# Patient Record
Sex: Female | Born: 1975 | Race: White | Hispanic: No | Marital: Single | Smoking: Never smoker
Health system: Southern US, Community
[De-identification: ages and names within clinical notes are randomized; demographics above are authoritative.]

## PROBLEM LIST (undated history)

## (undated) ENCOUNTER — Ambulatory Visit: Admission: RE | Discharge status: Absent without leave | Payer: BLUE CROSS/BLUE SHIELD | Source: Ambulatory Visit

---

## 1998-11-09 ENCOUNTER — Inpatient Hospital Stay (HOSPITAL_COMMUNITY): Admission: AD | Admit: 1998-11-09 | Discharge: 1998-11-09 | Payer: Self-pay | Admitting: Obstetrics & Gynecology

## 1998-11-19 ENCOUNTER — Other Ambulatory Visit: Admission: RE | Admit: 1998-11-19 | Discharge: 1998-11-19 | Payer: Self-pay | Admitting: Obstetrics and Gynecology

## 1998-11-28 ENCOUNTER — Inpatient Hospital Stay (HOSPITAL_COMMUNITY): Admission: AD | Admit: 1998-11-28 | Discharge: 1998-11-28 | Payer: Self-pay | Admitting: Obstetrics & Gynecology

## 1999-01-08 ENCOUNTER — Encounter: Payer: Self-pay | Admitting: Obstetrics and Gynecology

## 1999-01-08 ENCOUNTER — Inpatient Hospital Stay (HOSPITAL_COMMUNITY): Admission: AD | Admit: 1999-01-08 | Discharge: 1999-01-08 | Payer: Self-pay | Admitting: Obstetrics and Gynecology

## 1999-03-11 ENCOUNTER — Other Ambulatory Visit: Admission: RE | Admit: 1999-03-11 | Discharge: 1999-03-11 | Payer: Self-pay | Admitting: Obstetrics and Gynecology

## 1999-03-11 ENCOUNTER — Encounter (INDEPENDENT_AMBULATORY_CARE_PROVIDER_SITE_OTHER): Payer: Self-pay

## 1999-05-09 ENCOUNTER — Inpatient Hospital Stay (HOSPITAL_COMMUNITY): Admission: AD | Admit: 1999-05-09 | Discharge: 1999-05-09 | Payer: Self-pay | Admitting: Obstetrics and Gynecology

## 1999-05-27 ENCOUNTER — Inpatient Hospital Stay (HOSPITAL_COMMUNITY): Admission: AD | Admit: 1999-05-27 | Discharge: 1999-05-27 | Payer: Self-pay | Admitting: Obstetrics and Gynecology

## 1999-06-22 ENCOUNTER — Inpatient Hospital Stay (HOSPITAL_COMMUNITY): Admission: AD | Admit: 1999-06-22 | Discharge: 1999-06-24 | Payer: Self-pay | Admitting: Obstetrics and Gynecology

## 1999-06-27 ENCOUNTER — Inpatient Hospital Stay (HOSPITAL_COMMUNITY): Admission: AD | Admit: 1999-06-27 | Discharge: 1999-06-27 | Payer: Self-pay | Admitting: Obstetrics and Gynecology

## 1999-07-30 ENCOUNTER — Other Ambulatory Visit: Admission: RE | Admit: 1999-07-30 | Discharge: 1999-07-30 | Payer: Self-pay | Admitting: Obstetrics and Gynecology

## 1999-12-08 ENCOUNTER — Other Ambulatory Visit: Admission: RE | Admit: 1999-12-08 | Discharge: 1999-12-08 | Payer: Self-pay | Admitting: Obstetrics and Gynecology

## 2001-02-27 ENCOUNTER — Inpatient Hospital Stay (HOSPITAL_COMMUNITY): Admission: AD | Admit: 2001-02-27 | Discharge: 2001-03-03 | Payer: Self-pay | Admitting: Obstetrics and Gynecology

## 2001-04-20 ENCOUNTER — Other Ambulatory Visit: Admission: RE | Admit: 2001-04-20 | Discharge: 2001-04-20 | Payer: Self-pay | Admitting: Obstetrics and Gynecology

## 2004-06-03 ENCOUNTER — Other Ambulatory Visit: Admission: RE | Admit: 2004-06-03 | Discharge: 2004-06-03 | Payer: Self-pay | Admitting: Obstetrics and Gynecology

## 2005-10-27 ENCOUNTER — Other Ambulatory Visit: Admission: RE | Admit: 2005-10-27 | Discharge: 2005-10-27 | Payer: Self-pay | Admitting: Obstetrics and Gynecology

## 2009-02-28 ENCOUNTER — Emergency Department (HOSPITAL_COMMUNITY): Admission: EM | Admit: 2009-02-28 | Discharge: 2009-02-28 | Payer: Self-pay | Admitting: Emergency Medicine

## 2010-11-28 LAB — DIFFERENTIAL
Basophils Absolute: 0 10*3/uL (ref 0.0–0.1)
Eosinophils Absolute: 0 10*3/uL (ref 0.0–0.7)
Eosinophils Relative: 0 % (ref 0–5)
Lymphs Abs: 1.4 10*3/uL (ref 0.7–4.0)

## 2010-11-28 LAB — URINALYSIS, ROUTINE W REFLEX MICROSCOPIC
Bilirubin Urine: NEGATIVE
Glucose, UA: NEGATIVE mg/dL
Hgb urine dipstick: NEGATIVE
Ketones, ur: NEGATIVE mg/dL
Nitrite: NEGATIVE
Protein, ur: NEGATIVE mg/dL
Specific Gravity, Urine: 1.005 (ref 1.005–1.030)
Urobilinogen, UA: 0.2 mg/dL (ref 0.0–1.0)
pH: 7.5 (ref 5.0–8.0)

## 2010-11-28 LAB — D-DIMER, QUANTITATIVE: D-Dimer, Quant: 0.75 ug/mL-FEU — ABNORMAL HIGH (ref 0.00–0.48)

## 2010-11-28 LAB — BASIC METABOLIC PANEL
BUN: 2 mg/dL — ABNORMAL LOW (ref 6–23)
CO2: 28 mEq/L (ref 19–32)
Calcium: 8.8 mg/dL (ref 8.4–10.5)
Chloride: 91 mEq/L — ABNORMAL LOW (ref 96–112)
Creatinine, Ser: 0.57 mg/dL (ref 0.4–1.2)
GFR calc Af Amer: >60 mL/min (ref >60)
GFR calc non Af Amer: >60 mL/min (ref >60)
Glucose, Bld: 113 mg/dL — ABNORMAL HIGH (ref 70–99)
Potassium: 3.6 mEq/L (ref 3.5–5.1)
Sodium: 129 mEq/L — ABNORMAL LOW (ref 135–145)

## 2010-11-28 LAB — CBC
HCT: 34.8 % — ABNORMAL LOW (ref 36.0–46.0)
MCV: 96 fL (ref 78.0–100.0)
Platelets: 294 10*3/uL (ref 150–400)
RDW: 13.5 % (ref 11.5–15.5)

## 2010-11-28 LAB — PREGNANCY, URINE: Preg Test, Ur: NEGATIVE

## 2011-01-07 NOTE — Op Note (Signed)
[facility]  Patient:    Gwendolyn Ramirez, Gwendolyn Ramirez                   MRN: [id number] Proc. Date: 02/28/01 Adm. Date:  [id number] Attending:  Marcelle Overlie                           Operative Report  PREOPERATIVE DIAGNOSES:       1. Intrauterine pregnancy at 38+5 weeks.                               2. Failure to progress.  POSTOPERATIVE DIAGNOSES:      1. Intrauterine pregnancy at 38+5 weeks.                               2. Failure to progress.  OPERATION:                    Primary low segment transverse cesarean section.  SURGEON:                      Brook A. Edward Jolly, M.D.  ANESTHESIA:                   Epidural anesthesia.  IV FLUIDS:                    1900 cc Ringers lactate.  ESTIMATED BLOOD LOSS:         1200 cc.  URINE OUTPUT:                 225 cc.  COMPLICATIONS:                None.  INDICATIONS:                  The patient is a 35 year old gravida 4, para 1 female admitted on February 27, 2001, at 38+[redacted] weeks gestation in early labor. The patients cervix was noted to be 2 to 3 cm dilated and 80% effaced with the vertex high in the pelvis.  The fetal heart rate tracing was reassuring. Artificial rupture of membranes was performed at this time, and clear fluid was noted.  The patient received Pitocin for augmentation of her labor after an epidural had been placed.  An IUPC was also placed to assist in the labor augmentation.  The patient received Pitocin per high dose protocol, and she was noted to have inadequate [place] units.  The Pitocin intravenous fluid was changed, the dosage was decreased, and augmented again.  In the morning of February 28, 2001, the patient was noted to have adequate labor; however, she failed to progress cervical dilatation beyond 4 to 5 cm.  At this time, discussion was held with the patient regarding the failure to progress, and a recommendation was made by the obstetrician to proceed with a primary low segment  transverse cesarean section.  The risks and benefits were reviewed with the patient and her husband, and they chose to proceed.  FINDINGS:                     A viable female was delivered at 57 oclock on February 28, 2001, with Apgars of 8 at one minute and 9 at five minutes.  The position of the fetal  vertex was left occiput transverse.  There was minimal amniotic fluid which was appreciated.  The placenta had a normal insertion of a three vessel cord.  The fallopian tubes and ovaries were normal bilaterally.  SPECIMENS:                    None.  DESCRIPTION OF PROCEDURE:     With an IV, epidural, and Foley catheter in place, the patient was taken to the operating room suite from her labor and delivery room.  The patient was placed in the supine position, and the abdomen was sterilely prepped and draped. After adequate anesthesia was insured, a Pfannenstiel incision was created sharply with a scalpel. This was carried down to the fascia using a scalpel.  The fascia was scored in the midline with the scalpel, and the incision was then extended bilaterally using Mayo scissors.  The rectus muscles were sharply dissected from the overlying fascia using the Mayo scissors. The rectus muscles and the pyramidalis muscles were then separated in the midline using the Mayo scissors.  The parietal peritoneum was entered sharply with the Metzenbaum scissors after it was elevated with two hemostat clamps. The incision was extended both cranially and caudally with care taken to avoid enterotomy or cystotomy.  The lower uterine segment was exposed, and a bladder flap was created sharply with Metzenbaum scissors.  The lower uterine segment was then incised transversely with the scalpel, and the uterine cavity was entered bluntly with a hemostat clamp. The incision was then extended bilaterally with a bandage scissors. A hand was then inserted through the uterine incision, and the vertex was delivered  without difficulty. The nares and mouth were suctioned, and the remainder of the infant was delivered.  The cord was doubly clamped and cut, and the newborn was carried over to the awaiting pediatricians.  The placenta was manually extracted after cord blood was obtained. The uterus was exteriorized, and a moistened lap pad was used to wipe the uterine cavity of any remaining membranes.  There were none.  Bleeding vessels along the uterine incision at this time were clamped with Allis clamps.  The uterus was noted to be boggy, and Pitocin was administered intravenously for adequate contraction of the uterus. The uterine incision was then closed by placing a running locked suture of #1 chromic.  A hematoma was noted to develop in the apices bilaterally and inferiorly.  These each responded to a figure-of-eight suture of #1 chromic which were placed after a hand was placed behind the broad ligament to assure a proper placement of the sutures within the lower uterine segments bilaterally.  Hemostasis was noted to be excellent at this time with no expansion of the hematomas.  The uterus was returned to the peritoneal cavity which was suctioned of any remaining clots.  Hemostasis was excellent. The rectus muscles were brought together in the midline by placing a figure-of-eight suture of #1 chromic. The fascia was closed with a running suture of 0 Vicryl.  The subcutaneous tissue had excellent hemostasis. The skin was closed with staples followed by a sterile bandage over this.  The uterus was expressed of any remaining clots.  The patient was cleansed of any remaining Betadine.  She was escorted to the recovery room in stable and awake condition.  There were no complications to the procedure.  All sponge, needle and instrument counts were correct. DD:  02/28/01 TD:  02/28/01 Job: [id number]

## 2011-01-07 NOTE — Discharge Summary (Signed)
[facility]  Patient:    Gwendolyn Ramirez, Gwendolyn Ramirez                   MRN: [id number] Adm. Date:  [id number] Disc. Date: [id number] Attending:  Marcelle Overlie Dictator:   Leilani Able, P.A.                           Discharge Summary  FINAL DIAGNOSES:              Intrauterine pregnancy a 38 5/[redacted] weeks gestation, failure to progress.  PROCEDURE:                    Primary low segment transverse cesarean section.  SURGEON:                      Dr. Conley Simmonds.  COMPLICATIONS:                This 35 year old G29, P1 was admitted on July 9 at 38 ______ weeks in early labor.  AROM was performed at this time with clear fluid noted.  Patient received Pitocin for augmentation of her labor. By the morning of July 10 the patient was noted to have an adequate labor pattern; however, she failed to progress cervical dilation beyond 4-5 cm.  At this point discussion was held with the patient regarding failure to progress and a recommendation was made to proceed with a cesarean section.  Patient was taken to the operating room by Dr. Conley Simmonds on February 28, 2001 where a primary low transverse cesarean section was performed with the delivery of a 7 pound 5 ounce female infant with Apgars at 8 and 9.  The delivery went without complications.  Patients postoperative course was benign without significant fevers.  She did have some postoperative anemia and was therefore started on iron during her hospital course.  She was sent home on postoperative day #3 on a regular diet.  Told to decrease activities.  Told to continue prenatal vitamins and FeSo4.  Was given Tylox #25 one to two p.o. q.4h. p.r.n. pain. Was also sent home on Zoloft 50 mg one q.d. for postpartum depression. Patient was to follow-up in the office in four weeks. DD:  03/28/01 TD:  03/28/01 Job: [id number]

## 2011-07-08 IMAGING — CT CT ANGIO CHEST
2 of 6 series · 19 of 36 positions shown · IV contrast (APPLIED)
Comparison: Chest radiographs obtained earlier today.

CLINICAL DATA: Heart palpitations.  Elevated D-dimer.

CT ANGIOGRAPHY CHEST WITH CONTRAST
TECHNIQUE: Multidetector CT imaging of the chest was performed
using the standard protocol during bolus administration of
intravenous contrast. Multiplanar CT image reconstructions
including MIPs were obtained to evaluate the vascular anatomy.
Contrast: 80 ml 6mnipaque-OCC

[Series 7: thins · axial · 0.70mm/px · z∈[-262,-22]mm · 18 of 268 slices shown]
[im 14/268  lung]
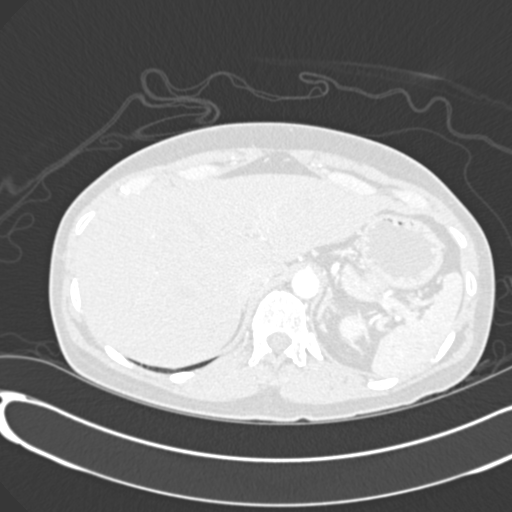
[im 27/268  mediastinal]
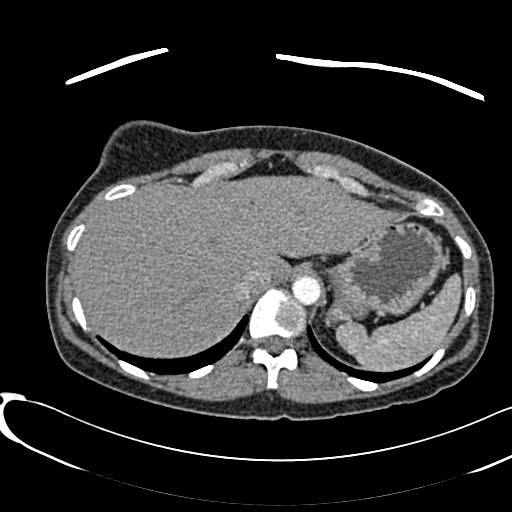
[im 41/268  lung]
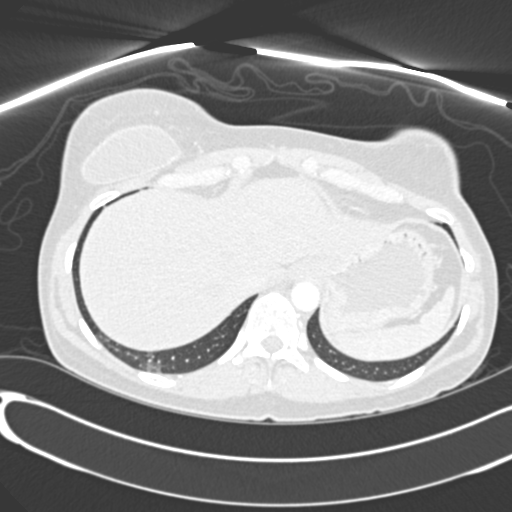
[im 54/268  mediastinal]
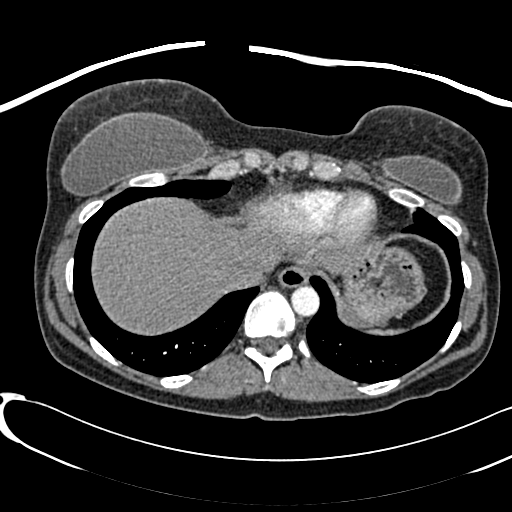
[im 67/268  lung]
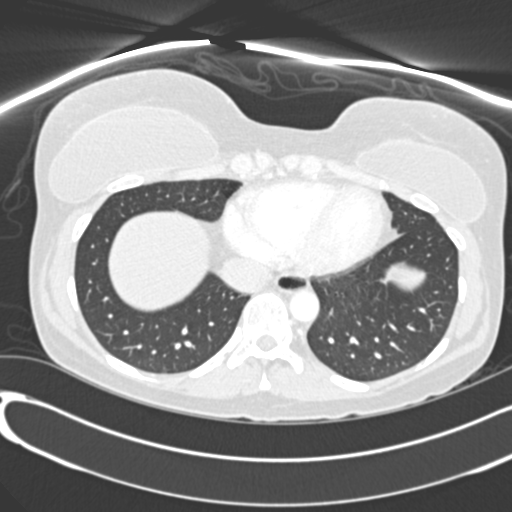
[im 81/268  mediastinal]
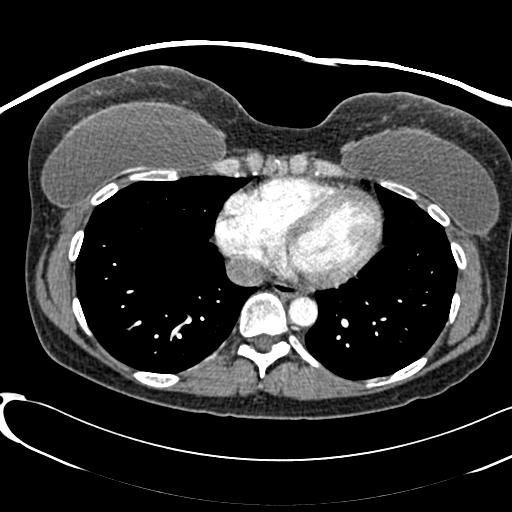
[im 94/268  lung]
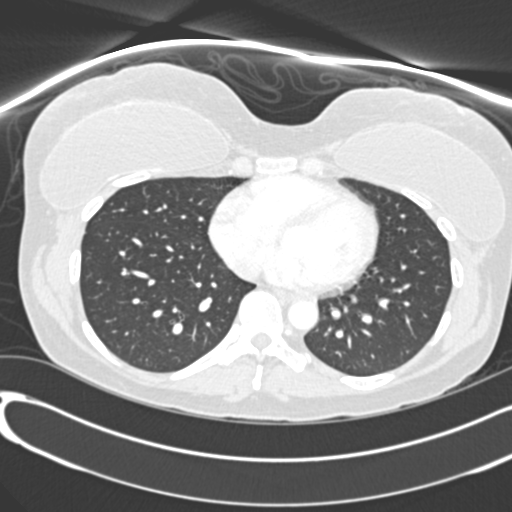
[im 107/268  mediastinal]
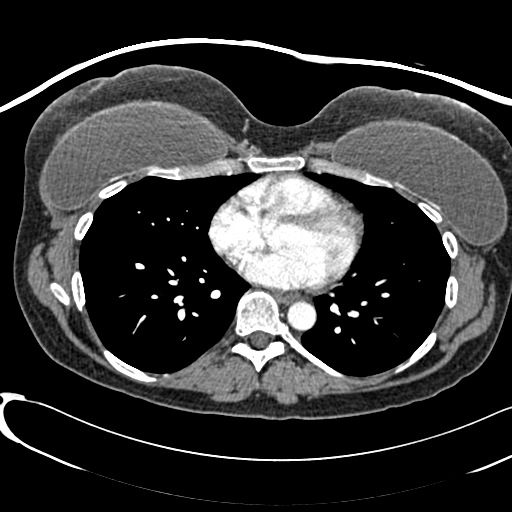
[im 121/268  lung]
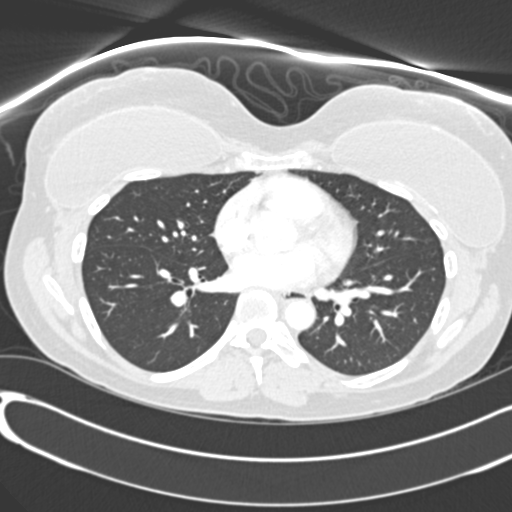
[im 147/268  mediastinal]
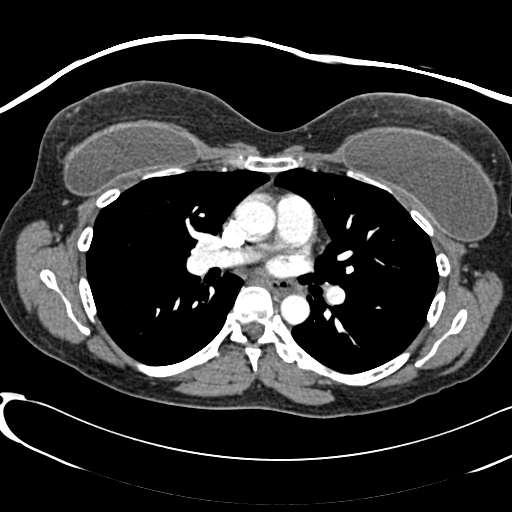
[im 161/268  lung]
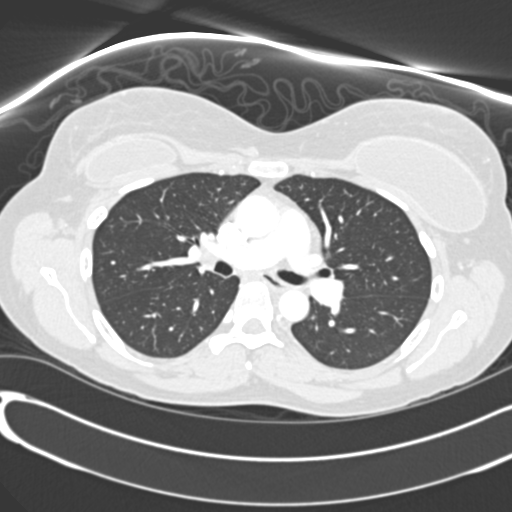
[im 174/268  mediastinal]
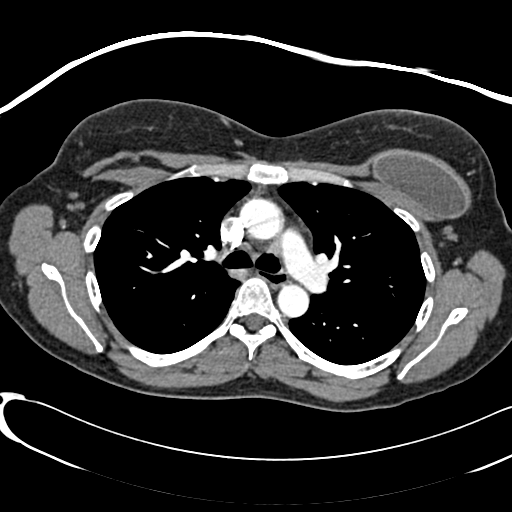
[im 187/268  lung]
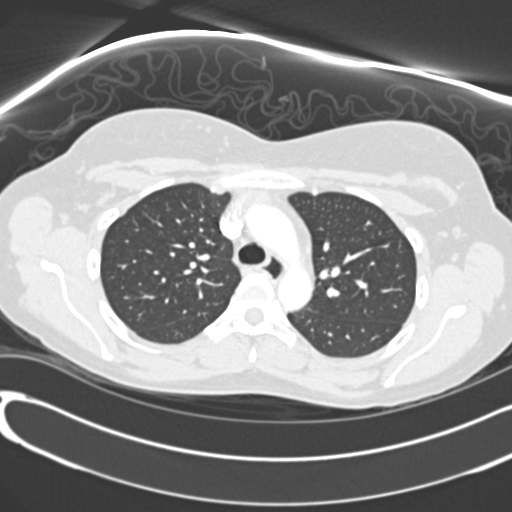
[im 201/268  mediastinal]
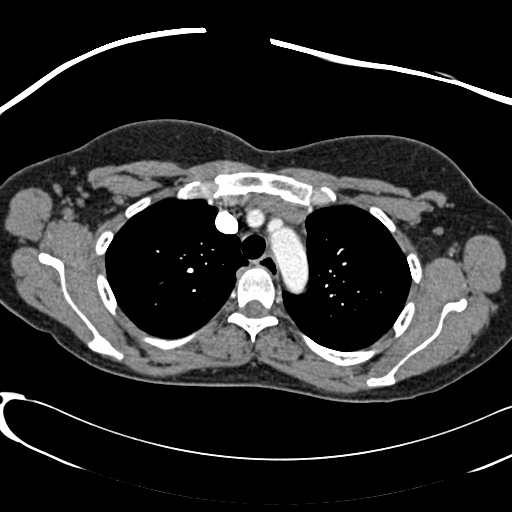
[im 214/268  lung]
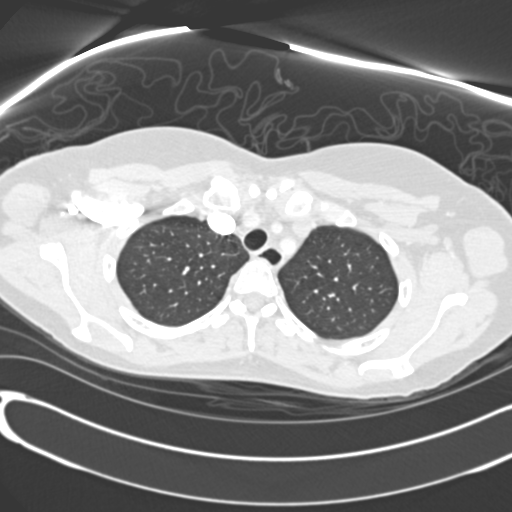
[im 227/268  mediastinal]
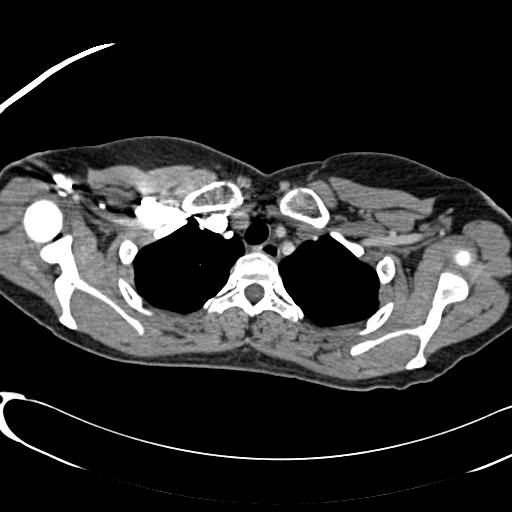
[im 241/268  lung]
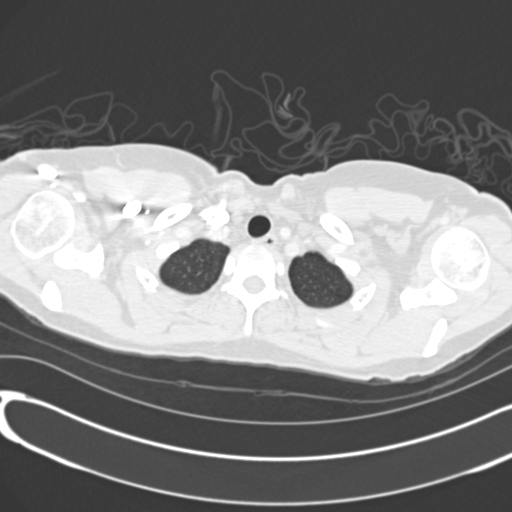
[im 254/268  mediastinal]
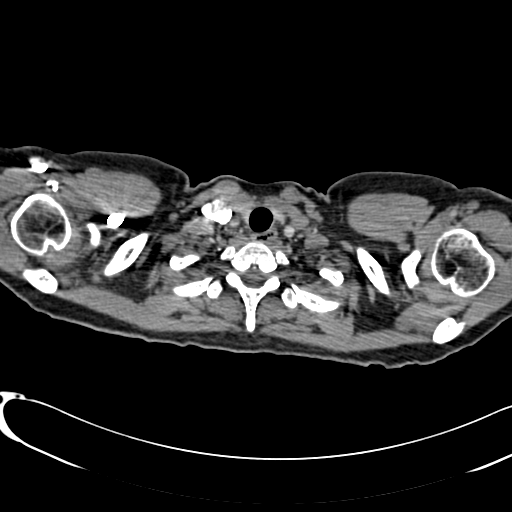

[Series 604: coronal mpr · coronal · 0.70mm/px · 1 of 89 slices shown]
[im 45/89  mediastinal]
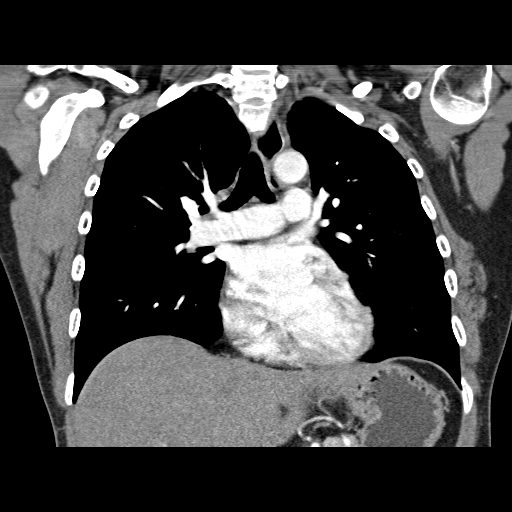

[19 of 36 positions shown; findings below may reference images not displayed]

FINDINGS: Bilateral subpectoralis breast implants.  Normally
opacified pulmonary arteries with no pulmonary arterial filling
defects seen.  Minimal right lower lobe atelectasis.  Clear left
lung.  No lung masses or enlarged lymph nodes.  Unremarkable bones
and upper abdomen.

Review of the MIP images confirms the above findings.
IMPRESSION: No pulmonary emboli or acute abnormality.

## 2014-11-20 ENCOUNTER — Emergency Department (HOSPITAL_COMMUNITY)
Admission: EM | Admit: 2014-11-20 | Discharge: 2014-11-21 | Disposition: A | Discharge status: Home / Self Care | Payer: BLUE CROSS/BLUE SHIELD | Attending: Emergency Medicine | Admitting: Emergency Medicine

## 2014-11-20 ENCOUNTER — Encounter (HOSPITAL_COMMUNITY): Payer: Self-pay | Admitting: Emergency Medicine

## 2014-11-20 DIAGNOSIS — Z3202 Encounter for pregnancy test, result negative: Secondary | ICD-10-CM | POA: Insufficient documentation

## 2014-11-20 DIAGNOSIS — R4182 Altered mental status, unspecified: Secondary | ICD-10-CM | POA: Diagnosis present

## 2014-11-20 DIAGNOSIS — F1910 Other psychoactive substance abuse, uncomplicated: Secondary | ICD-10-CM

## 2014-11-20 DIAGNOSIS — F1010 Alcohol abuse, uncomplicated: Secondary | ICD-10-CM | POA: Insufficient documentation

## 2014-11-20 DIAGNOSIS — F1410 Cocaine abuse, uncomplicated: Secondary | ICD-10-CM | POA: Insufficient documentation

## 2014-11-20 LAB — POC URINE PREG, ED: PREG TEST UR: NEGATIVE

## 2014-11-20 LAB — CBC WITH DIFFERENTIAL/PLATELET
BASOS ABS: 0 10*3/uL (ref 0.0–0.1)
BASOS PCT: 0 % (ref 0–1)
EOS ABS: 0.1 10*3/uL (ref 0.0–0.7)
EOS PCT: 1 % (ref 0–5)
HCT: 38 % (ref 36.0–46.0)
Hemoglobin: 12.5 g/dL (ref 12.0–15.0)
LYMPHS PCT: 37 % (ref 12–46)
Lymphs Abs: 2.6 10*3/uL (ref 0.7–4.0)
MCH: 31 pg (ref 26.0–34.0)
MCHC: 32.9 g/dL (ref 30.0–36.0)
MCV: 94.3 fL (ref 78.0–100.0)
Monocytes Absolute: 0.4 10*3/uL (ref 0.1–1.0)
Monocytes Relative: 6 % (ref 3–12)
Neutro Abs: 3.9 10*3/uL (ref 1.7–7.7)
Neutrophils Relative %: 56 % (ref 43–77)
PLATELETS: 320 10*3/uL (ref 150–400)
RBC: 4.03 MIL/uL (ref 3.87–5.11)
RDW: 14.4 % (ref 11.5–15.5)
WBC: 7 10*3/uL (ref 4.0–10.5)

## 2014-11-20 LAB — BASIC METABOLIC PANEL
ANION GAP: 11 (ref 5–15)
BUN: 5 mg/dL — ABNORMAL LOW (ref 6–23)
CALCIUM: 8.3 mg/dL — AB (ref 8.4–10.5)
CO2: 21 mmol/L (ref 19–32)
CREATININE: 0.67 mg/dL (ref 0.50–1.10)
Chloride: 115 mmol/L — ABNORMAL HIGH (ref 96–112)
GFR calc non Af Amer: >90 mL/min (ref >90)
Glucose, Bld: 103 mg/dL — ABNORMAL HIGH (ref 70–99)
POTASSIUM: 3.7 mmol/L (ref 3.5–5.1)
Sodium: 147 mmol/L — ABNORMAL HIGH (ref 135–145)

## 2014-11-20 LAB — RAPID URINE DRUG SCREEN, HOSP PERFORMED
AMPHETAMINES: NOT DETECTED
BARBITURATES: NOT DETECTED
Benzodiazepines: NOT DETECTED
COCAINE: POSITIVE — AB
OPIATES: NOT DETECTED
TETRAHYDROCANNABINOL: NOT DETECTED

## 2014-11-20 LAB — ETHANOL: ALCOHOL ETHYL (B): 397 mg/dL — AB (ref 0–9)

## 2014-11-20 LAB — ACETAMINOPHEN LEVEL

## 2014-11-20 LAB — SALICYLATE LEVEL: Salicylate Lvl: <4 mg/dL (ref 2.8–20.0)

## 2014-11-20 MED ORDER — ONDANSETRON HCL 4 MG/2ML IJ SOLN
4.0000 mg | Freq: Once | INTRAMUSCULAR | Status: AC
  Administered 2014-11-20: 4 mg via INTRAVENOUS
  Filled 2014-11-20: qty 2

## 2014-11-20 MED ORDER — SODIUM CHLORIDE 0.9 % IV BOLUS (SEPSIS)
1000.0000 mL | Freq: Once | INTRAVENOUS | Status: AC
  Administered 2014-11-20: 1000 mL via INTRAVENOUS

## 2014-11-20 NOTE — ED Provider Notes (Signed)
CSN: [id number]     Arrival date & time 11/20/14  1805 History   First MD Initiated Contact with Patient 11/20/14 1810     Chief Complaint  Patient presents with  . Altered Mental Status     (Consider location/radiation/quality/duration/timing/severity/associated sxs/prior Treatment) HPI    39 year old female with no sig PMH other than drinking a significant amount of alcohol, who comes in with altered mental status. Patient was reportedly in the shower when she had a possible syncopal episode.  Her boyfriend was there and he caught her, she didn't hit her head. Didn't drink a significant amount of alcohol today per him, "just a couple of drinks." She was reportedly minimally responsive for about 20 minutes but had normal vital signs and was satting well the entire time protecting her airway per EMS. On arrival to our department is able to provide some history, still a bit confused. She admits to alcohol today, but denies any other drug use. Reportedly with EMS her pupils were pinpoint to give her Narcan with possible minimal effect.  History reviewed. No pertinent past medical history. History reviewed. No pertinent past surgical history. No family history on file. History  Substance Use Topics  . Smoking status: Never Smoker   . Smokeless tobacco: Not on file  . Alcohol Use: 4.8 oz/week    4 Glasses of wine, 4 Shots of liquor per week   OB History    No data available     Review of Systems  Constitutional: Negative for fever and chills.  HENT: Negative for nosebleeds.   Eyes: Negative for visual disturbance.  Respiratory: Negative for cough and shortness of breath.   Cardiovascular: Negative for chest pain.  Gastrointestinal: Negative for nausea, vomiting, abdominal pain, diarrhea and constipation.  Genitourinary: Negative for dysuria.  Skin: Negative for rash.  Neurological: Negative for weakness.  All other systems reviewed and are negative.     Allergies  Review of  patient's allergies indicates no known allergies.  Home Medications   Prior to Admission medications   Not on File   BP 105/58 mmHg  Pulse 95  Resp 17  Ht 5\' 4"  (1.626 m)  Wt 130 lb (58.968 kg)  BMI 22.30 kg/m2  SpO2 100% Physical Exam  Constitutional: No distress.  HENT:  Head: Normocephalic and atraumatic.  Eyes: EOM are normal. Pupils are equal, round, and reactive to light.  Neck: Normal range of motion. Neck supple.  Cardiovascular: Normal rate and intact distal pulses.   Pulmonary/Chest: No respiratory distress.  Abdominal: Soft. There is no tenderness.  Musculoskeletal: Normal range of motion.  Neurological:  Sleepy but arousable.  Will open eyes to voice.  Speech slurred.  Normal strength in all four ext and normal sensation  Skin: No rash noted. She is not diaphoretic.  Psychiatric: She has a normal mood and affect.    ED Course  Procedures (including critical care time) Labs Review Labs Reviewed  BASIC METABOLIC PANEL - Abnormal; Notable for the following:    Sodium 147 (*)    Chloride 115 (*)    Glucose, Bld 103 (*)    BUN 5 (*)    Calcium 8.3 (*)    All other components within normal limits  ACETAMINOPHEN LEVEL - Abnormal; Notable for the following:    Acetaminophen (Tylenol), Serum <10.0 (*)    All other components within normal limits  ETHANOL - Abnormal; Notable for the following:    Alcohol, Ethyl (B) 397 (*)    All other  components within normal limits  URINE RAPID DRUG SCREEN (HOSP PERFORMED) - Abnormal; Notable for the following:    Cocaine POSITIVE (*)    All other components within normal limits  CBC WITH DIFFERENTIAL/PLATELET  SALICYLATE LEVEL  POC URINE PREG, ED    Imaging Review No results found.   EKG Interpretation   Date/Time:  Thursday November 20 2014 18:17:38 EDT Ventricular Rate:  107 PR Interval:  184 QRS Duration: 78 QT Interval:  341 QTC Calculation: 455 R Axis:   75 Text Interpretation:  Sinus tachycardia Right atrial  enlargement Low  voltage, precordial leads Baseline wander in lead(s) V5 Confirmed by  BEATON  MD, ROBERT ([id number]) on 11/20/2014 6:48:28 PM      MDM   Final diagnoses:  Polysubstance abuse     39 year old female with no sig PMH other than drinking a significant amount of alcohol, who comes in with altered mental status.   Exam as above, patient appears intoxicated at this time without any focal deficits.  Will obtain labs including ethanol/UDS  Labs sig for ethanol of almost 400, UDS + for cocaine.  Labs otherwise WNL  Will continue to observe, will give 1L NS  Patient is showing improvement in mental status, more alert.   Patient now not clinically intoxicated.  Able to ambulate without difficulty.  Speech doesn't appear slurred at this time.  She will be going home with her boyfriend who feels comfortable taking her home and the patient wants to leave.  I have discussed the results, Dx and Tx plan with the patient. They expressed understanding and agree with the plan and were told to return to ED with any worsening of condition or concern.    Disposition: Discharge  Condition: Good  There are no discharge medications for this patient.   Follow Up: No follow-up provider specified.  Pt seen in conjunction with Dr. Demetria Pore, MD 11/21/14 0040  Nelva Nay, MD 12/02/14 [phone]

## 2014-11-20 NOTE — ED Notes (Signed)
Per ems pt had near syncope event after getting out of the shower. Pts friend other helped her to the floor. On arrival EMS states pt was unresponsive and 1 mg of narcan was given. On arrival to ED pt is alert and ox4. Admits to drinking alcohol denies any drug use. Pt denies any pain.

## 2014-11-20 NOTE — Discharge Instructions (Signed)
Chemical Dependency  Chemical dependency is an addiction to drugs or alcohol. It is characterized by the repeated behavior of seeking out and using drugs and alcohol despite harmful consequences to the health and safety of ones self and others.   RISK FACTORS  There are certain situations or behaviors that increase a person's risk for chemical dependency. These include:  · A family history of chemical dependency.  · A history of mental health issues, including depression and anxiety.  · A home environment where drugs and alcohol are easily available to you.  · Drug or alcohol use at a young age.  SYMPTOMS   The following symptoms can indicate chemical dependency:  · Inability to limit the use of drugs or alcohol.  · Nausea, sweating, shakiness, and anxiety that occurs when alcohol or drugs are not being used.  · An increase in amount of drugs or alcohol that is necessary to get drunk or high.  People who experience these symptoms can assess their use of drugs and alcohol by asking themselves the following questions:  · Have you been told by friends or family that they are worried about your use of alcohol or drugs?  · Do friends and family ever tell you about things you did while drinking alcohol or using drugs that you do not remember?  · Do you lie about using alcohol or drugs or about the amounts you use?  · Do you have difficulty completing daily tasks unless you use alcohol or drugs?  · Is the level of your work or school performance lower because of your drug or alcohol use?  · Do you get sick from using drugs or alcohol but keep using anyway?  · Do you feel uncomfortable in social situations unless you use alcohol or drugs?  · Do you use drugs or alcohol to help forget problems?   An answer of yes to any of these questions may indicate chemical dependency. Professional evaluation is suggested.  Document Released: 08/02/2001 Document Revised: 10/31/2011 Document Reviewed: 10/14/2010  Exit[employer]® Patient  Information ©2015 [employer]. This information is not intended to replace advice given to you by your health care provider. Make sure you discuss any questions you have with your health care provider.

## 2015-08-03 ENCOUNTER — Emergency Department (HOSPITAL_COMMUNITY)
Admission: EM | Admit: 2015-08-03 | Discharge: 2015-08-03 | Disposition: A | Discharge status: Home / Self Care | Payer: BLUE CROSS/BLUE SHIELD | Attending: Emergency Medicine | Admitting: Emergency Medicine

## 2015-08-03 ENCOUNTER — Encounter (HOSPITAL_COMMUNITY): Payer: Self-pay | Admitting: Family Medicine

## 2015-08-03 DIAGNOSIS — E860 Dehydration: Secondary | ICD-10-CM | POA: Diagnosis not present

## 2015-08-03 DIAGNOSIS — R112 Nausea with vomiting, unspecified: Secondary | ICD-10-CM | POA: Diagnosis present

## 2015-08-03 DIAGNOSIS — R51 Headache: Secondary | ICD-10-CM | POA: Insufficient documentation

## 2015-08-03 DIAGNOSIS — F1010 Alcohol abuse, uncomplicated: Secondary | ICD-10-CM | POA: Diagnosis not present

## 2015-08-03 DIAGNOSIS — R000 Tachycardia, unspecified: Secondary | ICD-10-CM | POA: Insufficient documentation

## 2015-08-03 LAB — CBC
HCT: 40.2 % (ref 36.0–46.0)
Hemoglobin: 13.2 g/dL (ref 12.0–15.0)
MCH: 30.6 pg (ref 26.0–34.0)
MCHC: 32.8 g/dL (ref 30.0–36.0)
MCV: 93.3 fL (ref 78.0–100.0)
PLATELETS: 407 10*3/uL — AB (ref 150–400)
RBC: 4.31 MIL/uL (ref 3.87–5.11)
RDW: 13.6 % (ref 11.5–15.5)
WBC: 8.4 10*3/uL (ref 4.0–10.5)

## 2015-08-03 LAB — URINALYSIS, ROUTINE W REFLEX MICROSCOPIC
Bilirubin Urine: NEGATIVE
GLUCOSE, UA: NEGATIVE mg/dL
Hgb urine dipstick: NEGATIVE
Ketones, ur: 15 mg/dL — AB
LEUKOCYTES UA: NEGATIVE
Nitrite: NEGATIVE
PROTEIN: NEGATIVE mg/dL
Specific Gravity, Urine: 1.006 (ref 1.005–1.030)
pH: 6 (ref 5.0–8.0)

## 2015-08-03 LAB — COMPREHENSIVE METABOLIC PANEL
ALBUMIN: 4.3 g/dL (ref 3.5–5.0)
ALT: 28 U/L (ref 14–54)
ANION GAP: 17 — AB (ref 5–15)
AST: 43 U/L — AB (ref 15–41)
Alkaline Phosphatase: 50 U/L (ref 38–126)
BUN: 9 mg/dL (ref 6–20)
CHLORIDE: 100 mmol/L — AB (ref 101–111)
CO2: 18 mmol/L — ABNORMAL LOW (ref 22–32)
Calcium: 8.4 mg/dL — ABNORMAL LOW (ref 8.9–10.3)
Creatinine, Ser: 0.78 mg/dL (ref 0.44–1.00)
GFR calc Af Amer: >60 mL/min (ref >60)
Glucose, Bld: 67 mg/dL (ref 65–99)
POTASSIUM: 4.4 mmol/L (ref 3.5–5.1)
Sodium: 135 mmol/L (ref 135–145)
TOTAL PROTEIN: 7.1 g/dL (ref 6.5–8.1)
Total Bilirubin: 1.1 mg/dL (ref 0.3–1.2)

## 2015-08-03 LAB — LIPASE, BLOOD: LIPASE: 21 U/L (ref 11–51)

## 2015-08-03 MED ORDER — ACETAMINOPHEN 500 MG PO TABS
1000.0000 mg | ORAL_TABLET | Freq: Once | ORAL | Status: AC
  Administered 2015-08-03: 1000 mg via ORAL
  Filled 2015-08-03: qty 2

## 2015-08-03 MED ORDER — SODIUM CHLORIDE 0.9 % IV SOLN
1000.0000 mL | INTRAVENOUS | Status: DC
  Administered 2015-08-03: 1000 mL via INTRAVENOUS

## 2015-08-03 MED ORDER — FAMOTIDINE IN NACL 20-0.9 MG/50ML-% IV SOLN
20.0000 mg | Freq: Once | INTRAVENOUS | Status: AC
  Administered 2015-08-03: 20 mg via INTRAVENOUS
  Filled 2015-08-03: qty 50

## 2015-08-03 MED ORDER — SODIUM CHLORIDE 0.9 % IV SOLN
1000.0000 mL | Freq: Once | INTRAVENOUS | Status: AC
  Administered 2015-08-03: 1000 mL via INTRAVENOUS

## 2015-08-03 NOTE — ED Provider Notes (Signed)
CSN: [id number]     Arrival date & time 08/03/15  1004 History   First MD Initiated Contact with Patient 08/03/15 1425     Chief Complaint  Patient presents with  . Nausea  . Emesis     (Consider location/radiation/quality/duration/timing/severity/associated sxs/prior Treatment) HPI A she reports that she had weight much to drink last night. She reports she drank hard liquor. She reports she is concerned for alcohol poisoning. She continues to feel very weak and nauseated with a headache. He has been sipping water. She denies there was any fall or injury that she is aware of. She does not have other areas of localizing pain. Patient reports that she does not typically drink like this. She reports typical alcohol use once a month. History reviewed. No pertinent past medical history. History reviewed. No pertinent past surgical history. History reviewed. No pertinent family history. Social History  Substance Use Topics  . Smoking status: Never Smoker   . Smokeless tobacco: None  . Alcohol Use: 4.8 oz/week    4 Glasses of wine, 4 Shots of liquor per week   OB History    No data available     Review of Systems 10 Systems reviewed and are negative for acute change except as noted in the HPI.    Allergies  Review of patient's allergies indicates no known allergies.  Home Medications   Prior to Admission medications   Not on File   BP 130/96 mmHg  Pulse 109  Temp(Src) 98.3 F (36.8 C)  Resp 16  SpO2 99% Physical Exam  Constitutional: She is oriented to person, place, and time. She appears well-developed and well-nourished.  HENT:  Head: Normocephalic and atraumatic.  Eyes: EOM are normal. Pupils are equal, round, and reactive to light.  Neck: Neck supple.  Cardiovascular: Regular rhythm, normal heart sounds and intact distal pulses.   Tachycardia  Pulmonary/Chest: Effort normal and breath sounds normal.  Abdominal: Soft. Bowel sounds are normal. She exhibits no  distension. There is no tenderness.  Musculoskeletal: Normal range of motion. She exhibits no edema.  Neurological: She is alert and oriented to person, place, and time. She has normal strength. Coordination normal. GCS eye subscore is 4. GCS verbal subscore is 5. GCS motor subscore is 6.  Skin: Skin is warm, dry and intact.  Psychiatric: She has a normal mood and affect.    ED Course  Procedures (including critical care time) Labs Review Labs Reviewed  COMPREHENSIVE METABOLIC PANEL - Abnormal; Notable for the following:    Chloride 100 (*)    CO2 18 (*)    Calcium 8.4 (*)    AST 43 (*)    Anion gap 17 (*)    All other components within normal limits  CBC - Abnormal; Notable for the following:    Platelets 407 (*)    All other components within normal limits  URINALYSIS, ROUTINE W REFLEX MICROSCOPIC (NOT AT [facility]) - Abnormal; Notable for the following:    Ketones, ur 15 (*)    All other components within normal limits  LIPASE, BLOOD  CBG MONITORING, ED    Imaging Review No results found. I have personally reviewed and evaluated these images and lab results as part of my medical decision-making.   EKG Interpretation None      MDM   Final diagnoses:  Dehydration  Alcohol consumption binge drinking   Patient has symptomatically improved. She is alert and well appearance without signs of encephalopathy or confusion. She is taking  fluids orally.     Arby BarretteMarcy Loomis Anacker, MD 08/03/15 [phone]

## 2015-08-03 NOTE — ED Notes (Signed)
Pt sts that she drank too much last night and feels awful. sts N,V,HA. sts she doesn't drink everyday just drank too much last night.

## 2015-08-03 NOTE — Discharge Instructions (Signed)
Alcohol Intoxication  Alcohol intoxication occurs when the amount of alcohol that a person has consumed impairs his or her ability to mentally and physically function. Alcohol directly impairs the normal chemical activity of the brain. Drinking large amounts of alcohol can lead to changes in mental function and behavior, and it can cause many physical effects that can be harmful.   Alcohol intoxication can range in severity from mild to very severe. Various factors can affect the level of intoxication that occurs, such as the person's age, gender, weight, frequency of alcohol consumption, and the presence of other medical conditions (such as diabetes, seizures, or heart conditions). Dangerous levels of alcohol intoxication may occur when people drink large amounts of alcohol in a short period (binge drinking). Alcohol can also be especially dangerous when combined with certain prescription medicines or "recreational" drugs.  SIGNS AND SYMPTOMS  Some common signs and symptoms of mild alcohol intoxication include:  · Loss of coordination.  · Changes in mood and behavior.  · Impaired judgment.  · Slurred speech.  As alcohol intoxication progresses to more severe levels, other signs and symptoms will appear. These may include:  · Vomiting.  · Confusion and impaired memory.  · Slowed breathing.  · Seizures.  · Loss of consciousness.  DIAGNOSIS   Your health care provider will take a medical history and perform a physical exam. You will be asked about the amount and type of alcohol you have consumed. Blood tests will be done to measure the concentration of alcohol in your blood. In many places, your blood alcohol level must be lower than 80 mg/dL (0.08%) to legally drive. However, many dangerous effects of alcohol can occur at much lower levels.   TREATMENT   People with alcohol intoxication often do not require treatment. Most of the effects of alcohol intoxication are temporary, and they go away as the alcohol naturally  leaves the body. Your health care provider will monitor your condition until you are stable enough to go home. Fluids are sometimes given through an IV access tube to help prevent dehydration.   HOME CARE INSTRUCTIONS  · Do not drive after drinking alcohol.  · Stay hydrated. Drink enough water and fluids to keep your urine clear or pale yellow. Avoid caffeine.    · Only take over-the-counter or prescription medicines as directed by your health care provider.    SEEK MEDICAL CARE IF:   · You have persistent vomiting.    · You do not feel better after a few days.  · You have frequent alcohol intoxication. Your health care provider can help determine if you should see a substance use treatment counselor.  SEEK IMMEDIATE MEDICAL CARE IF:   · You become shaky or tremble when you try to stop drinking.    · You shake uncontrollably (seizure).    · You throw up (vomit) blood. This may be bright red or may look like black coffee grounds.    · You have blood in your stool. This may be bright red or may appear as a black, tarry, bad smelling stool.    · You become lightheaded or faint.    MAKE SURE YOU:   · Understand these instructions.  · Will watch your condition.  · Will get help right away if you are not doing well or get worse.     This information is not intended to replace advice given to you by your health care provider. Make sure you discuss any questions you have with your health care provider.       Document Released: 05/18/2005 Document Revised: 04/10/2013 Document Reviewed: 01/11/2013  [employer] Patient Education ©2016 [employer]

## 2015-08-04 LAB — CBG MONITORING, ED: Glucose-Capillary: 120 mg/dL — ABNORMAL HIGH (ref 65–99)
